# Patient Record
Sex: Male | Born: 1970 | Race: Black or African American | Hispanic: No | Marital: Single | Smoking: Former smoker
Health system: Southern US, Community
[De-identification: ages and names within clinical notes are randomized; demographics above are authoritative.]

---

## 1999-12-06 ENCOUNTER — Emergency Department (HOSPITAL_COMMUNITY): Admission: EM | Admit: 1999-12-06 | Discharge: 1999-12-06 | Payer: Self-pay | Admitting: Emergency Medicine

## 1999-12-21 ENCOUNTER — Encounter: Payer: Self-pay | Admitting: Urology

## 1999-12-21 ENCOUNTER — Encounter: Admission: RE | Admit: 1999-12-21 | Discharge: 1999-12-21 | Payer: Self-pay | Admitting: Urology

## 2015-04-25 ENCOUNTER — Emergency Department (HOSPITAL_COMMUNITY)
Admission: EM | Admit: 2015-04-25 | Discharge: 2015-04-25 | Disposition: A | Discharge status: Home / Self Care | Payer: No Typology Code available for payment source | Attending: Emergency Medicine | Admitting: Emergency Medicine

## 2015-04-25 ENCOUNTER — Emergency Department (HOSPITAL_COMMUNITY): Payer: No Typology Code available for payment source

## 2015-04-25 ENCOUNTER — Encounter (HOSPITAL_COMMUNITY): Payer: Self-pay | Admitting: Emergency Medicine

## 2015-04-25 DIAGNOSIS — S2231XA Fracture of one rib, right side, initial encounter for closed fracture: Secondary | ICD-10-CM

## 2015-04-25 DIAGNOSIS — Y92410 Unspecified street and highway as the place of occurrence of the external cause: Secondary | ICD-10-CM | POA: Insufficient documentation

## 2015-04-25 DIAGNOSIS — Y998 Other external cause status: Secondary | ICD-10-CM | POA: Insufficient documentation

## 2015-04-25 DIAGNOSIS — S22068A Other fracture of T7-T8 thoracic vertebra, initial encounter for closed fracture: Secondary | ICD-10-CM | POA: Diagnosis not present

## 2015-04-25 DIAGNOSIS — S299XXA Unspecified injury of thorax, initial encounter: Secondary | ICD-10-CM | POA: Diagnosis present

## 2015-04-25 DIAGNOSIS — Y9389 Activity, other specified: Secondary | ICD-10-CM | POA: Insufficient documentation

## 2015-04-25 MED ORDER — IBUPROFEN 800 MG PO TABS: 800.0000 mg | ORAL_TABLET | Freq: Three times a day (TID) | ORAL | Status: AC

## 2015-04-25 MED ORDER — HYDROCODONE-ACETAMINOPHEN 5-325 MG PO TABS: 1.0000 | ORAL_TABLET | ORAL | Status: AC | PRN

## 2015-04-25 NOTE — Discharge Instructions (Signed)
You were evaluated in the ED today for your rib pain. Your x-ray showed a small fracture of your eighth rib on the right side. Continue your breathing exercises as we discussed. Your rib should heal on its own. Take your Motrin for mild to moderate pain and your Norco for breakthrough pain. Do not take the Norco before driving as it can make you very drowsy. Follow-up with your doctor as needed. Return to ED for any new or worsening symptoms.  Rib Fracture A rib fracture is a break or crack in one of the bones of the ribs. The ribs are a group of long, curved bones that wrap around your chest and attach to your spine. They protect your lungs and other organs in the chest cavity. A broken or cracked rib is often painful, but most do not cause other problems. Most rib fractures heal on their own over time. However, rib fractures can be more serious if multiple ribs are broken or if broken ribs move out of place and push against other structures. CAUSES   A direct blow to the chest. For example, this could happen during contact sports, a car accident, or a fall against a hard object.  Repetitive movements with high force, such as pitching a baseball or having severe coughing spells. SYMPTOMS   Pain when you breathe in or cough.  Pain when someone presses on the injured area. DIAGNOSIS  Your caregiver will perform a physical exam. Various imaging tests may be ordered to confirm the diagnosis and to look for related injuries. These tests may include a chest X-ray, computed tomography (CT), magnetic resonance imaging (MRI), or a bone scan. TREATMENT  Rib fractures usually heal on their own in 1-3 months. The longer healing period is often associated with a continued cough or other aggravating activities. During the healing period, pain control is very important. Medication is usually given to control pain. Hospitalization or surgery may be needed for more severe injuries, such as those in which multiple  ribs are broken or the ribs have moved out of place.  HOME CARE INSTRUCTIONS   Avoid strenuous activity and any activities or movements that cause pain. Be careful during activities and avoid bumping the injured rib.  Gradually increase activity as directed by your caregiver.  Only take over-the-counter or prescription medications as directed by your caregiver. Do not take other medications without asking your caregiver first.  Apply ice to the injured area for the first 1-2 days after you have been treated or as directed by your caregiver. Applying ice helps to reduce inflammation and pain.  Put ice in a plastic bag.  Place a towel between your skin and the bag.   Leave the ice on for 15-20 minutes at a time, every 2 hours while you are awake.  Perform deep breathing as directed by your caregiver. This will help prevent pneumonia, which is a common complication of a broken rib. Your caregiver may instruct you to:  Take deep breaths several times a day.  Try to cough several times a day, holding a pillow against the injured area.  Use a device called an incentive spirometer to practice deep breathing several times a day.  Drink enough fluids to keep your urine clear or pale yellow. This will help you avoid constipation.   Do not wear a rib belt or binder. These restrict breathing, which can lead to pneumonia.  SEEK IMMEDIATE MEDICAL CARE IF:   You have a fever.   You  have difficulty breathing or shortness of breath.   You develop a continual cough, or you cough up thick or bloody sputum.  You feel sick to your stomach (nausea), throw up (vomit), or have abdominal pain.   You have worsening pain not controlled with medications.  MAKE SURE YOU:  Understand these instructions.  Will watch your condition.  Will get help right away if you are not doing well or get worse.   This information is not intended to replace advice given to you by your health care provider.  Make sure you discuss any questions you have with your health care provider.   Document Released: 03/18/2005 Document Revised: 11/18/2012 Document Reviewed: 05/20/2012 Elsevier Interactive Patient Education [employer]
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
